# Patient Record
Sex: Female | Born: 2000 | Hispanic: Yes | Marital: Single | Smoking: Never smoker
Health system: Southern US, Community
[De-identification: ages and names within clinical notes are randomized; demographics above are authoritative.]

## PROBLEM LIST (undated history)

## (undated) DIAGNOSIS — Z789 Other specified health status: Secondary | ICD-10-CM

## (undated) HISTORY — DX: Other specified health status: Z78.9

---
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
## 2019-11-12 ENCOUNTER — Encounter (HOSPITAL_BASED_OUTPATIENT_CLINIC_OR_DEPARTMENT_OTHER): Payer: Self-pay

## 2019-11-12 ENCOUNTER — Other Ambulatory Visit: Payer: Self-pay

## 2019-11-12 ENCOUNTER — Emergency Department (HOSPITAL_BASED_OUTPATIENT_CLINIC_OR_DEPARTMENT_OTHER)
Admission: EM | Admit: 2019-11-12 | Discharge: 2019-11-12 | Disposition: A | Discharge status: Home / Self Care | Payer: No Typology Code available for payment source | Attending: Emergency Medicine | Admitting: Emergency Medicine

## 2019-11-12 ENCOUNTER — Emergency Department (HOSPITAL_BASED_OUTPATIENT_CLINIC_OR_DEPARTMENT_OTHER): Payer: No Typology Code available for payment source

## 2019-11-12 ENCOUNTER — Encounter: Payer: Self-pay | Admitting: Emergency Medicine

## 2019-11-12 ENCOUNTER — Ambulatory Visit
Admission: EM | Admit: 2019-11-12 | Discharge: 2019-11-12 | Disposition: A | Discharge status: Home / Self Care | Payer: Self-pay

## 2019-11-12 DIAGNOSIS — Y999 Unspecified external cause status: Secondary | ICD-10-CM | POA: Diagnosis not present

## 2019-11-12 DIAGNOSIS — S0990XA Unspecified injury of head, initial encounter: Secondary | ICD-10-CM

## 2019-11-12 DIAGNOSIS — Y939 Activity, unspecified: Secondary | ICD-10-CM | POA: Insufficient documentation

## 2019-11-12 DIAGNOSIS — Y92410 Unspecified street and highway as the place of occurrence of the external cause: Secondary | ICD-10-CM | POA: Diagnosis not present

## 2019-11-12 DIAGNOSIS — S022XXA Fracture of nasal bones, initial encounter for closed fracture: Secondary | ICD-10-CM | POA: Insufficient documentation

## 2019-11-12 DIAGNOSIS — S0083XA Contusion of other part of head, initial encounter: Secondary | ICD-10-CM

## 2019-11-12 LAB — PREGNANCY, URINE: Preg Test, Ur: NEGATIVE

## 2019-11-12 MED ORDER — NAPROXEN 500 MG PO TABS: 500.0000 mg | ORAL_TABLET | Freq: Two times a day (BID) | ORAL | 0 refills | Status: DC

## 2019-11-12 NOTE — Discharge Instructions (Signed)
Please go to [facility] for further evaluation  
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
## 2021-07-14 ENCOUNTER — Encounter: Payer: Self-pay | Admitting: Obstetrics and Gynecology
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
## 2022-02-03 ENCOUNTER — Ambulatory Visit
Admission: EM | Admit: 2022-02-03 | Discharge: 2022-02-03 | Disposition: A | Discharge status: Home / Self Care | Payer: PRIVATE HEALTH INSURANCE | Attending: Physician Assistant | Admitting: Physician Assistant

## 2022-02-03 ENCOUNTER — Ambulatory Visit
Admission: EM | Admit: 2022-02-03 | Discharge: 2022-02-03 | Discharge status: Absent without leave | Payer: PRIVATE HEALTH INSURANCE

## 2022-02-03 DIAGNOSIS — N3000 Acute cystitis without hematuria: Secondary | ICD-10-CM | POA: Diagnosis present

## 2022-02-03 LAB — POCT URINALYSIS DIP (MANUAL ENTRY)
Bilirubin, UA: NEGATIVE
Glucose, UA: NEGATIVE mg/dL
Ketones, POC UA: NEGATIVE mg/dL
Nitrite, UA: NEGATIVE
Protein Ur, POC: 100 mg/dL — AB
Spec Grav, UA: 1.015 (ref 1.010–1.025)
Urobilinogen, UA: 1 E.U./dL
pH, UA: 7 (ref 5.0–8.0)

## 2022-02-03 LAB — POCT URINE PREGNANCY: Preg Test, Ur: NEGATIVE

## 2022-02-03 MED ORDER — NITROFURANTOIN MONOHYD MACRO 100 MG PO CAPS: 100.0000 mg | ORAL_CAPSULE | Freq: Two times a day (BID) | ORAL | 0 refills | Status: DC

## 2022-02-03 NOTE — ED Triage Notes (Signed)
Pt c/o right rib pain, dysuria, amber-colored urine, onset last week. Also describes fever at home last night.

## 2022-02-03 NOTE — ED Provider Notes (Signed)
[facility]    CSN: [id number] Arrival date & time: 02/03/22  1013      History   Chief Complaint Chief Complaint  Patient presents with   Dysuria    HPI Katie Roman is a 21 y.o. female.   Patient here today for evaluation of amber-colored urine, dysuria that started last week.  She notes that last night she started to have fever.  She has had some right anterior rib pain as well.  She has not had any nausea or vomiting.  She has taken Tylenol without resolution of symptoms.  The history is provided by the patient.  Dysuria Associated symptoms: no abdominal pain, no fever, no nausea and no vomiting     History reviewed. No pertinent past medical history.  There are no problems to display for this patient.   History reviewed. No pertinent surgical history.  OB History   No obstetric history on file.      Home Medications    Prior to Admission medications   Medication Sig Start Date End Date Taking? Authorizing Provider  nitrofurantoin, macrocrystal-monohydrate, (MACROBID) 100 MG capsule Take 1 capsule (100 mg total) by mouth 2 (two) times daily. 02/03/22  Yes Francene Finders, PA-C  naproxen (NAPROSYN) 500 MG tablet Take 1 tablet (500 mg total) by mouth 2 (two) times daily. 11/12/19   Carlisle Cater, PA-C    Family History Family History  Problem Relation Age of Onset   Diabetes Father     Social History Social History   Tobacco Use   Smoking status: Never   Smokeless tobacco: Never  Substance Use Topics   Alcohol use: Never   Drug use: Never     Allergies   Patient has no known allergies.   Review of Systems Review of Systems  Constitutional:  Negative for chills and fever.  Eyes:  Negative for discharge and redness.  Respiratory:  Negative for shortness of breath.   Gastrointestinal:  Negative for abdominal pain, nausea and vomiting.  Genitourinary:  Positive for dysuria.     Physical Exam Triage Vital Signs ED  Triage Vitals [02/03/22 1027]  Enc Vitals Group     BP 115/82     Pulse Rate (!) 110     Resp 16     Temp 98 F (36.7 C)     Temp Source Oral     SpO2 98 %     Weight      Height      Head Circumference      Peak Flow      Pain Score 7     Pain Loc      Pain Edu?      Excl. in [place]?    No data found.  Updated Vital Signs BP 115/82 (BP Location: Left Arm)   Pulse (!) 110   Temp 98 F (36.7 C) (Oral)   Resp 16   SpO2 98%   Physical Exam Vitals and nursing note reviewed.  Constitutional:      General: She is not in acute distress.    Appearance: Normal appearance. She is not ill-appearing.  HENT:     Head: Normocephalic and atraumatic.  Eyes:     Conjunctiva/sclera: Conjunctivae normal.  Cardiovascular:     Rate and Rhythm: Normal rate.  Pulmonary:     Effort: Pulmonary effort is normal.  Neurological:     Mental Status: She is alert.  Psychiatric:        Mood and Affect:  Mood normal.        Behavior: Behavior normal.        Thought Content: Thought content normal.      UC Treatments / Results  Labs (all labs ordered are listed, but only abnormal results are displayed) Labs Reviewed  POCT URINALYSIS DIP (MANUAL ENTRY) - Abnormal; Notable for the following components:      Result Value   Clarity, UA hazy (*)    Blood, UA large (*)    Protein Ur, POC =100 (*)    Leukocytes, UA Large (3+) (*)    All other components within normal limits  URINE CULTURE  POCT URINE PREGNANCY    EKG   Radiology No results found.  Procedures Procedures (including critical care time)  Medications Ordered in UC Medications - No data to display  Initial Impression / Assessment and Plan / UC Course  I have reviewed the triage vital signs and the nursing notes.  Pertinent labs & imaging results that were available during my care of the patient were reviewed by me and considered in my medical decision making (see chart for details).   UA consistent with UTI.  Macrobid  prescribed for same.  Urine culture ordered.  It is unclear if of right rib pain is related to current symptoms or just coincidental timing.  Recommended follow-up if no gradual improvement or with any further concerns.  Final Clinical Impressions(s) / UC Diagnoses   Final diagnoses:  Acute cystitis without hematuria   Discharge Instructions   None    ED Prescriptions     Medication Sig Dispense Auth. Provider   nitrofurantoin, macrocrystal-monohydrate, (MACROBID) 100 MG capsule Take 1 capsule (100 mg total) by mouth 2 (two) times daily. 10 capsule Tomi Bamberger, PA-C      PDMP not reviewed this encounter.   Tomi Bamberger, PA-C 02/03/22 1119

## 2022-02-05 LAB — URINE CULTURE: Culture: >=100000 — AB

## 2024-03-29 ENCOUNTER — Ambulatory Visit: Payer: PRIVATE HEALTH INSURANCE

## 2024-03-29 VITALS — BP 126/78 | HR 94

## 2024-03-29 DIAGNOSIS — Z3200 Encounter for pregnancy test, result unknown: Secondary | ICD-10-CM

## 2024-03-29 DIAGNOSIS — Z3201 Encounter for pregnancy test, result positive: Secondary | ICD-10-CM

## 2024-03-29 LAB — POCT URINE PREGNANCY: Preg Test, Ur: POSITIVE — AB

## 2024-03-29 NOTE — Progress Notes (Signed)
.  Ms. Hernandez-Plata presents today for UPT. She has no unusual complaints. LMP:02/20/24    OBJECTIVE: Appears well, in no apparent distress.  OB History     Gravida  1   Para      Term      Preterm      AB      Living         SAB      IAB      Ectopic      Multiple      Live Births             Home UPT Result:Positive In-Office UPT result:Positive I have reviewed the patient's medical, obstetrical, social, and family histories, and medications.   ASSESSMENT: Positive pregnancy test  PLAN Prenatal care to be completed at: [facility] Provided safe med list Pt will get Prenatals OTC at this time due to lapse in insurance

## 2024-04-26 ENCOUNTER — Other Ambulatory Visit (INDEPENDENT_AMBULATORY_CARE_PROVIDER_SITE_OTHER): Payer: Self-pay

## 2024-04-26 ENCOUNTER — Ambulatory Visit (INDEPENDENT_AMBULATORY_CARE_PROVIDER_SITE_OTHER): Payer: Self-pay | Admitting: *Deleted

## 2024-04-26 VITALS — BP 117/63 | HR 75 | Wt 165.2 lb

## 2024-04-26 DIAGNOSIS — O3680X0 Pregnancy with inconclusive fetal viability, not applicable or unspecified: Secondary | ICD-10-CM

## 2024-04-26 DIAGNOSIS — Z3480 Encounter for supervision of other normal pregnancy, unspecified trimester: Secondary | ICD-10-CM | POA: Insufficient documentation

## 2024-04-26 DIAGNOSIS — Z3401 Encounter for supervision of normal first pregnancy, first trimester: Secondary | ICD-10-CM

## 2024-04-26 DIAGNOSIS — Z3A10 10 weeks gestation of pregnancy: Secondary | ICD-10-CM

## 2024-04-26 MED ORDER — PREPLUS 27-1 MG PO TABS: 1.0000 | ORAL_TABLET | Freq: Every day | ORAL | 13 refills | Status: AC

## 2024-04-26 NOTE — Patient Instructions (Signed)
 [facility] [employer] [facility] has a partnership with the [employer] to provide prenatal navigation for the most needed resources in our community. In order to see how we can help connect you to these resources we need consent to contact you. Please complete the very short consent using the link below:   English Link: https://[email or web address].[email or web address]?site=16  Spanish Link: https://[email or web address].tfaforms.[email or web address]?site=16  [facility]      [address]rd floor [place], [place] [address] Tel: [phone]  Attention residents of [facility] who do not qualify for Medicaid, and who do not have insurance, and/ or money to cover prenatal care:  Early and adequate prenatal care is very important for you and your baby.  If you or someone you know is pregnant and needs prenatal care they can:  Make an appointment with the [facility] Patient Coordinator by calling [phone] and bring the following with you to the appointment:  1. Medicaid Denial Letter 2. Proof of Pregnancy 3. $100 (cash or Money order) 4. Photo ID  Develop a healthy lifestyle for you and your baby by eating nutritious food and exercising. Apply for [facility] if needed by calling  [phone]. Begin taking a vitamin with at least 400 mcg of Folic Acid each day to help prevent birth defects. Avoid tobacco products, alcohol, and drugs.  Call 1-800-QUIT-NOW for help to quit smoking.  Text BABY to 551-119-3946 and get FREE educational messages on your cell phone to help you through your pregnancy and your baby's first year. For more info, visit www.text4baby.org   (Information provided by the [facility] on [employer])

## 2024-04-26 NOTE — Progress Notes (Signed)
 New OB Intake  I connected with Katie Roman  on 04/26/2024 at  2:10 PM EST by In PersonVisit and verified that I am speaking with the correct person using two identifiers. Nurse is located at [facility] and pt is located at [place].  I discussed the limitations, risks, security and privacy concerns of performing an evaluation and management service by telephone and the availability of in person appointments. I also discussed with the patient that there may be a patient responsible charge related to this service. The patient expressed understanding and agreed to proceed.  I explained I am completing New OB Intake today. We discussed EDD of 11/22/24 based on LMP of 02/16/24. Pt is G1P0000. I reviewed her allergies, medications and Medical/Surgical/OB history.    Patient Active Problem List   Diagnosis Date Noted   Supervision of other normal pregnancy, antepartum 04/26/2024     Concerns addressed today  Delivery Plans Plans to deliver at [facility]. Discussed the nature of our practice with multiple providers including residents and students as well as female and female providers. Due to the size of the practice, the delivering provider may not be the same as those providing prenatal care.   Patient is not interested in water birth.  MyChart/Babyscripts MyChart access verified. I explained pt will have some visits in office and some virtually. Babyscripts instructions given and order placed. Patient verifies receipt of registration text/e-mail. Account successfully created and app downloaded. If patient is a candidate for Optimized scheduling, add to sticky note.   Blood Pressure Cuff/Weight Scale Patient is self-pay; explained patient will be given BP cuff at first prenatal appt. Explained after first prenatal appt pt will check weekly and document in Babyscripts. Patient does not have weight scale; patient may purchase if they desire to track weight weekly in Babyscripts.  Anatomy  US  Explained first scheduled US  will be around 19 weeks. Anatomy US  scheduled for TBD at TBD.  Is patient a candidate for Babyscripts Optimization? Yes, patient accepted    First visit review I reviewed new OB appt with patient. Explained pt will be seen by Katie Moats, PA at first visit. Discussed Katie Roman genetic screening with patient. Requests [facility] and [employer].. Routine prenatal labs collected at today's visit.   Last Pap No results found for: DIAGPAP  Katie CHRISTELLA Ober, RN 04/26/2024  3:08 PM

## 2024-04-28 ENCOUNTER — Inpatient Hospital Stay (HOSPITAL_COMMUNITY)
Admission: AD | Admit: 2024-04-28 | Discharge: 2024-04-28 | Disposition: A | Discharge status: Home / Self Care | Payer: Self-pay | Attending: Obstetrics and Gynecology | Admitting: Obstetrics and Gynecology

## 2024-04-28 ENCOUNTER — Other Ambulatory Visit: Payer: Self-pay

## 2024-04-28 DIAGNOSIS — O23591 Infection of other part of genital tract in pregnancy, first trimester: Secondary | ICD-10-CM | POA: Insufficient documentation

## 2024-04-28 DIAGNOSIS — O26851 Spotting complicating pregnancy, first trimester: Secondary | ICD-10-CM | POA: Insufficient documentation

## 2024-04-28 DIAGNOSIS — B9689 Other specified bacterial agents as the cause of diseases classified elsewhere: Secondary | ICD-10-CM | POA: Insufficient documentation

## 2024-04-28 DIAGNOSIS — Z3A10 10 weeks gestation of pregnancy: Secondary | ICD-10-CM

## 2024-04-28 DIAGNOSIS — N939 Abnormal uterine and vaginal bleeding, unspecified: Secondary | ICD-10-CM

## 2024-04-28 DIAGNOSIS — O26891 Other specified pregnancy related conditions, first trimester: Secondary | ICD-10-CM

## 2024-04-28 LAB — WET PREP, GENITAL
Sperm: NONE SEEN
Trich, Wet Prep: NONE SEEN
WBC, Wet Prep HPF POC: >=10 — AB
Yeast Wet Prep HPF POC: NONE SEEN

## 2024-04-28 LAB — URINE CULTURE, OB REFLEX

## 2024-04-28 LAB — CULTURE, OB URINE

## 2024-04-28 MED ORDER — METRONIDAZOLE 500 MG PO TABS: 500.0000 mg | ORAL_TABLET | Freq: Two times a day (BID) | ORAL | 0 refills | Status: AC

## 2024-04-28 NOTE — MAU Note (Signed)
 Katie Roman is a 24 y.o. at [redacted]w[redacted]d here in MAU reporting: was having lower back pain yesterday that is gone now. Went to the bathroom and light spotting when she wiped. Is not wearing a pad. Denies any abd pain today. Patient reports yellow/green vaginal discharge yesterday but none today so far. Denies any vaginal itching, odor or pain. Denies any recent sexual intercourse.   Results from US  Thursday 04/26/24:  Single viable intrauterine pregnancy at [redacted]w[redacted]d by CRL with  ultrasound EDD 11/25/2024  This is concordant with her LMP for assigned EDD of 11/22/2024  Onset of complaint: today Pain score: 0 Vitals:   04/28/24 1018  BP: (!) 115/57  Pulse: 82  Resp: 16  Temp: 98.2 F (36.8 C)  SpO2: 100%     QYU:juuzfeuzi, unable to obtain Lab orders placed from triage:

## 2024-04-28 NOTE — MAU Provider Note (Signed)
 CC/ Vaginal spotting while wiping    S/HPI  Ms. Katie Roman is a 24 y.o. G1P0000 patient who presents to MAU today with complaint of ***.     O BP (!) 115/57 (BP Location: Right Arm)   Pulse 82   Temp 98.2 F (36.8 C) (Oral)   Resp 16   Ht 5' 6 (1.676 m)   Wt 74.4 kg   LMP 02/16/2024 (Exact Date)   SpO2 100%   BMI 26.49 kg/m  Physical Exam Vitals and nursing note reviewed.  Constitutional:      General: She is not in acute distress.    Appearance: Normal appearance. She is not ill-appearing.  HENT:     Head: Normocephalic.     Nose: Nose normal.     Mouth/Throat:     Mouth: Mucous membranes are moist.  Cardiovascular:     Rate and Rhythm: Normal rate.     Pulses: Normal pulses.  Pulmonary:     Effort: Pulmonary effort is normal.  Abdominal:     General: There is no distension.     Palpations: Abdomen is soft.     Tenderness: There is no abdominal tenderness.  Musculoskeletal:        General: Normal range of motion.     Cervical back: Normal range of motion.  Skin:    General: Skin is warm.  Neurological:     Mental Status: She is alert and oriented to person, place, and time.  Psychiatric:        Mood and Affect: Mood normal.        Behavior: Behavior normal.     MDM  MODERATE/ BSUS performed    Spotting in early pregnancy  Vaginal bleeding in early pregnacy CBC HCG Quant ABO OB Ultrasound Vaginal Swabs UA   Differential diagnosis considered for 1st trimester vaginal bleeding includes but is not limited to: ectopic pregnancy, complete spontaneous abortion, incomplete abortion, missed abortion, threatened abortion, embryonic/fetal demise, cervical insufficiency, cervical or vaginal disorder     BSUS  Pt informed that the ultrasound is considered a limited OB ultrasound and is not intended to be a complete ultrasound exam.  Patient also informed that the ultrasound is not being completed with the intent of assessing for fetal or  placental anomalies or any pelvic abnormalities.  Explained that the purpose of todays ultrasound is to assess for  maternal reassurance .  Patient acknowledges the purpose of the exam and the limitations of the study.   FHR at 174, IUP visualized with cardiac activity   Orders Placed This Encounter  Procedures   Wet prep, genital    Standing Status:   Standing    Number of Occurrences:   1   Discharge patient Discharge disposition: 01-Home or Self Care; Discharge patient date: 04/28/2024    Standing Status:   Standing    Number of Occurrences:   1    Discharge disposition:   01-Home or Self Care [1]    Discharge patient date:   04/28/2024      Results for orders placed or performed during the hospital encounter of 04/28/24 (from the past 24 hours)  Wet prep, genital     Status: Abnormal   Collection Time: 04/28/24 10:35 AM  Result Value Ref Range   Yeast Wet Prep HPF POC NONE SEEN NONE SEEN   Trich, Wet Prep NONE SEEN NONE SEEN   Clue Cells Wet Prep HPF POC PRESENT (A) NONE SEEN   WBC, Wet Prep HPF  POC >=10 (A) <10   Sperm NONE SEEN       ASSESSMENT Medical screening exam complete  Vaginal spotting Only when wiping Likely related to Vaginal infection BSUS reassuring  [redacted] weeks gestation of pregnancy FHR at 174 via BSUS IUP visualized  Bacterial vaginosis Wet prep with Clue cells c/w BV Flagyl  RX 500 mg BID x 7 days sent to patient's pharmacy    PLAN Future Appointments  Date Time Provider Department Center  05/11/2024  9:15 AM Davis, Devon E, PA-C [facility] None    Discharge from MAU in stable condition  See AVS for full description of educational information and instructions provided to the patient at time of discharge   Warning signs for worsening condition that would warrant emergency follow-up discussed  Patient may return to MAU as needed   Littie Olam LABOR, NP 04/28/2024 10:58 AM

## 2024-04-28 NOTE — Discharge Instructions (Signed)
 Commonly Asked Questions During Pregnancy  How Will I Feel When I'm Pregnant? Pregnancy symptoms in the first trimester of pregnancy may not appear until the middle or end of the second month. Hormonal changes will cause tenderness in your breasts, and you may begin to feel more tired than usual. Food cravings, an increase in the need to urinate, and morning sickness may all be more noticeable.  Pregnancy symptoms in the second trimester are more prominent. You may start to feel the baby move and become more active. Dental issues, nasal/sinus problems, and skin irritations can begin to appear. Heartburn, leg cramps, dizziness, and a vaginal discharge are also common. Every woman is different when it comes to the symptoms they experience, and some may not experience any at all. Pregnancy symptoms in the third trimester can include increased frequency in urination, leg cramps, constipation, ligament pain in the abdomen, and weight gain. Back pain and Braxton Hicks contractions will become increasingly more common.  Why is nutrition during pregnancy important? Eating well is one of the best things you can do during pregnancy. Good nutrition helps you handle the extra demands on your body as your pregnancy progresses. The goal is to balance getting enough nutrients to support the growth of your fetus and maintaining a healthy weight.  How much water should I drink during pregnancy? During pregnancy you should drink 8 to 12 cups (64 to 96 ounces) of water every day. Water has many benefits. It aids digestion and helps form the amniotic fluid around the fetus. Water also helps nutrients circulate in the body and helps waste leave the body.  What can I do to help with nausea? Eat dry toast or crackers in the morning before you get out of bed to avoid moving around on an empty stomach. Eat five or six "mini meals" a day to ensure that your stomach is never empty. Eat frequent bites of foods like nuts,  fruits, or crackers.  What can help with constipation during pregnancy? Constipation is common near the end of pregnancy. Eating more foods with fiber can help fight constipation. Fiber is found in fruits, vegetables, whole grains, beans, nuts, and seeds. You should aim for about 25 grams of fiber in your diet each day. Drink a lot of water as you increase your fiber intake.  How much coffee can I drink while I'm pregnant? Research suggests that moderate caffeine consumption (less than 200 milligrams per day) does not cause miscarriage or preterm birth. That's the amount in one 12-ounce cup of coffee. Remember that caffeine also is found in tea, chocolate, energy drinks, and soft drinks. Caffeine can interfere with sleep and contribute to nausea and light-headedness. Caffeine also can increase urination and lead to dehydration.  What can I do to prevent or ease back pain during pregnancy? There are several things you can do to prevent or ease back pain. For example, wear supportive clothing and shoes. Pay attention to your position when sitting, sleeping, and lifting things. If you need to stand for a long time, rest one foot on a stool or a box to take the strain off your back. You also can use heat or cold to soothe sore muscles.  Is it safe to exercise during pregnancy? If you are healthy and your pregnancy is normal, it is safe to continue or start regular physical activity. Physical activity does not increase your risk of miscarriage, low birth weight, or early delivery. It's still important to discuss exercise with your ob-gyn  provider during your early prenatal visits.   What are the benefits of exercise during pregnancy? Regular exercise during pregnancy benefits you and your fetus in these key ways: Reduces back pain Eases constipation May decrease your risk of gestational diabetes, preeclampsia, and cesarean birth Promotes healthy weight gain during pregnancy Improves your overall  fitness and strengthens your heart and blood vessels Helps you to lose the baby weight after your baby is born  Is it safe to dye my hair during pregnancy? Yes, it's safe. Only a small amount of chemicals from hair dye is absorbed through the scalp.  Is it safe to keep a cat during pregnancy? Yes, you can keep your cat. You may have heard that cat feces can carry the infection toxoplasmosis. This infection is only found in cats who go outdoors and hunt prey, such as mice and other rodents. If you do have a cat who goes outdoors or eats prey, have someone else take over daily cleaning the litter box. This will keep you away from any cat feces. If you have an indoor cat who only eats cat food and doesn't have contact with outside animals, your risk of toxoplasmosis is very low.  What substances should I avoid during pregnancy? During pregnancy, women should not use tobacco, alcohol, marijuana, illegal drugs, or prescription medications for nonmedical reasons. Avoiding these substances and getting regular prenatal care are important to having a healthy pregnancy and a healthy baby.   What foods to I need to avoid in pregnancy? To help prevent listeriosis, avoid eating the following foods while you are pregnant: Unpasteurized milk and foods made with unpasteurized milk, including soft cheeses Hot dogs and luncheon meats, unless they are heated until steaming hot just before serving Unwashed raw produce such as fruits and vegetables  Avoid all raw and undercooked seafood, eggs, meat, and poultry while you are pregnant. Do not eat sushi made with raw fish (cooked sushi is safe). Cooking and pasteurization are the only ways to kill Listeria.  Limit your exposure to mercury by not eating bigeye tuna, king mackerel, marlin, orange roughy, shark, swordfish, or tilefish. Limit eating white (albacore) tuna to 6 ounces a week. You do not have to avoid all fish during pregnancy. In fact, fish and shellfish are  nutritious foods with vital nutrients for a pregnant woman and her fetus. Be sure to eat at least 8-12 ounces of low-mercury fish and shellfish per week.  Is travel safe to during pregnancy? In most cases, pregnant women can travel safely until close to their due dates. But travel may not be recommended for women who have pregnancy complications. If you are planning a trip, talk with your (ob-gyn) provider. And no matter how you choose to travel, think ahead about your comfort and safety.  Can I use a sauna or hot tub early in pregnancy? It's best not to. Your core body temperature rises when you use saunas and hot tubs. This rise in temperature can be harmful for your fetus.  Can I get a massage while pregnant? Yes. Massage is a good way to relax and improve circulation. The best position for a massage while you're pregnant is lying on your side, rather than facedown. Some massage tables have a cut-out for the belly, allowing you to lie facedown comfortably. Tell your massage therapist that you're pregnant if you're not showing yet. Many health spas offer special prenatal massages done by therapists who are trained to work on pregnant women.  Is Having Dental  Work While Pregnant Safe? Pregnancy and dental work questions are common for expecting moms. Preventive dental cleanings and annual exams during pregnancy are not only safe but are recommended. The rise in hormone levels during pregnancy causes the gums to swell, bleed, and trap food causing increased irritation to your gums. Preventive dental work while pregnant is essential to avoid oral infections such as gum disease, which has been linked to preterm birth. The American Dental Association (ADA) recommends pregnant women eat a balanced diet, brush their teeth thoroughly with ADA-approved fluoride toothpaste twice a day, and floss daily. Have preventive exams and cleanings during your pregnancy. Let your dentist know you are  pregnant. Postpone non-emergency dental work until the second trimester or after delivery, if possible. Elective procedures should be postponed until after the delivery.    Safe Medications in Pregnancy   Acne:  Benzoyl Peroxide  Salicylic Acid   Backache/Headache:  Tylenol: 2 regular strength every 4 hours OR               2 Extra strength every 6 hours   Colds/Coughs/Allergies:  Benadryl (alcohol free) 25 mg every 6 hours as needed  Breath right strips  Claritin  Cepacol throat lozenges  Chloraseptic throat spray  Cold-Eeze- up to three times per day  Cough drops, alcohol free  Flonase (by prescription only)  Guaifenesin  Mucinex  Robitussin DM (plain only, alcohol free)  Saline nasal spray/drops  Sudafed (pseudoephedrine) & Actifed * use only after [redacted] weeks gestation and if you do not have high blood pressure  Tylenol  Vicks Vaporub  Zinc lozenges  Zyrtec   Constipation:  Colace  Ducolax suppositories  Fleet enema  Glycerin suppositories  Metamucil  Milk of magnesia  Miralax  Senokot  Smooth move tea   Diarrhea:  Kaopectate  Imodium A-D   *NO pepto Bismol   Hemorrhoids:  Anusol  Anusol HC  Preparation H  Tucks   Indigestion:  Tums  Maalox  Mylanta  Zantac  Pepcid   Insomnia:  Benadryl (alcohol free) 25mg  every 6 hours as needed  Tylenol PM  Unisom, no Gelcaps   Leg Cramps:  Tums  MagGel   Nausea/Vomiting:  Bonine  Dramamine  Emetrol  Ginger extract  Sea bands  Meclizine  Nausea medication to take during pregnancy:  Unisom (doxylamine succinate 25 mg tablets) Take one tablet daily at bedtime. If symptoms are not adequately controlled, the dose can be increased to a maximum recommended dose of two tablets daily (1/2 tablet in the morning, 1/2 tablet mid-afternoon and one at bedtime).  Vitamin B6 100mg  tablets. Take one tablet twice a day (up to 200 mg per day).   Skin Rashes:  Aveeno products  Benadryl cream or 25mg  every 6 hours  as needed  Calamine Lotion  1% cortisone cream   Yeast infection:  Gyne-lotrimin 7  Monistat 7    **If taking multiple medications, please check labels to avoid duplicating the same active ingredients  **take medication as directed on the label  ** Do not exceed 4000 mg of tylenol in 24 hours  **Do not take medications that contain aspirin or ibuprofen

## 2024-04-30 LAB — GC/CHLAMYDIA PROBE AMP (~~LOC~~) NOT AT ARMC
Chlamydia: NEGATIVE
Comment: NEGATIVE
Comment: NORMAL
Neisseria Gonorrhea: NEGATIVE

## 2024-05-03 LAB — PANORAMA PRENATAL TEST FULL PANEL:PANORAMA TEST PLUS 5 ADDITIONAL MICRODELETIONS: FETAL FRACTION: 4.4

## 2024-05-04 ENCOUNTER — Ambulatory Visit: Payer: Self-pay | Admitting: Obstetrics and Gynecology

## 2024-05-04 DIAGNOSIS — Z3480 Encounter for supervision of other normal pregnancy, unspecified trimester: Secondary | ICD-10-CM

## 2024-05-11 ENCOUNTER — Ambulatory Visit (INDEPENDENT_AMBULATORY_CARE_PROVIDER_SITE_OTHER): Payer: Self-pay | Admitting: Physician Assistant

## 2024-05-11 ENCOUNTER — Other Ambulatory Visit (HOSPITAL_COMMUNITY)
Admission: RE | Admit: 2024-05-11 | Discharge: 2024-05-11 | Disposition: A | Payer: Self-pay | Source: Ambulatory Visit | Attending: Physician Assistant | Admitting: Physician Assistant

## 2024-05-11 ENCOUNTER — Encounter: Payer: Self-pay | Admitting: Physician Assistant

## 2024-05-11 VITALS — BP 118/68 | HR 92 | Wt 164.6 lb

## 2024-05-11 DIAGNOSIS — Z3480 Encounter for supervision of other normal pregnancy, unspecified trimester: Secondary | ICD-10-CM | POA: Insufficient documentation

## 2024-05-11 DIAGNOSIS — Z124 Encounter for screening for malignant neoplasm of cervix: Secondary | ICD-10-CM

## 2024-05-11 DIAGNOSIS — O26892 Other specified pregnancy related conditions, second trimester: Secondary | ICD-10-CM

## 2024-05-11 DIAGNOSIS — Z3A12 12 weeks gestation of pregnancy: Secondary | ICD-10-CM

## 2024-05-11 DIAGNOSIS — N898 Other specified noninflammatory disorders of vagina: Secondary | ICD-10-CM

## 2024-05-11 LAB — CERVICOVAGINAL ANCILLARY ONLY
Bacterial Vaginitis (gardnerella): NEGATIVE
Candida Glabrata: NEGATIVE
Candida Vaginitis: NEGATIVE
Chlamydia: NEGATIVE
Comment: NEGATIVE
Comment: NEGATIVE
Comment: NEGATIVE
Comment: NEGATIVE
Comment: NEGATIVE
Comment: NORMAL
Neisseria Gonorrhea: NEGATIVE
Trichomonas: NEGATIVE

## 2024-05-11 NOTE — Progress Notes (Signed)
 NOB  Pt would like to discuss vaginal discharge (sometimes yellow).  Pt would like to discuss mild pressure in lower abdomen.

## 2024-05-11 NOTE — Progress Notes (Signed)
 "   PRENATAL VISIT NOTE  Subjective:  Katie Roman is a 24 y.o. G1P0000 at [redacted]w[redacted]d being seen today for her first prenatal visit for this pregnancy.  She is currently monitored for the following issues for this low-risk pregnancy and has Supervision of other normal pregnancy, antepartum on their problem list.  Patient reports yellow vaginal discharge that she is concerned about without itching, burning, malodor.  Previous episode of vaginal spotting this pregnancy, went to MAU for this 04/28/24. She has had no spotting since then.    Contractions: Not present. Vag. Bleeding: None.  Movement: Present. Denies leaking of fluid.   She is planning to breastfeed. Desires unsure for contraception.   The following portions of the patient's history were reviewed and updated as appropriate: allergies, current medications, past family history, past medical history, past social history, past surgical history and problem list.   Objective:   Vitals:   05/11/24 0915  BP: 118/68  Pulse: 92  Weight: 164 lb 9.6 oz (74.7 kg)    Fetal Status: Fetal Heart Rate (bpm): visual on tablet   Movement: Present     General:  Alert, oriented and cooperative. Patient is in no acute distress.  Skin: Skin is warm and dry. No rash noted.   Cardiovascular: Normal heart rate and rhythm noted  Respiratory: Normal respiratory effort, no problems with respiration noted. Clear to auscultation.   Abdomen: Soft, gravid, appropriate for gestational age. Normal bowel sounds. Non-tender. Pain/Pressure: Present     Pelvic: Cervical exam deferred       Normal cervical contour, no lesions, no bleeding following pap, normal discharge  Extremities: Normal range of motion.  Edema: None  Mental Status: Normal mood and affect. Normal behavior. Normal judgment and thought content.    Indications for ASA therapy (per uptodate) One of the following: Previous pregnancy with preeclampsia, especially early onset and with an  adverse outcome No Multifetal gestation No Chronic hypertension No Type 1 or 2 diabetes mellitus No Chronic kidney disease No Autoimmune disease (antiphospholipid syndrome, systemic lupus erythematosus) No  Two or more of the following: Nulliparity Yes Obesity (body mass index >30 kg/m2) No Family history of preeclampsia in mother or sister No Age >=35 years No Sociodemographic characteristics (African American race, low socioeconomic level) No Personal risk factors (eg, previous pregnancy with low birth weight or small for gestational age infant, previous adverse pregnancy outcome [eg, stillbirth], interval >10 years between pregnancies) No   Assessment and Plan:  Pregnancy: G1P0000 at [redacted]w[redacted]d  1. Supervision of other normal pregnancy, antepartum (Primary) Initial labs drawn. Continue prenatal vitamins. Genetic Screening discussed: NIPS, carrier screening and AFP  Ultrasound discussed; fetal anatomic survey: 07/05/24 Problem list reviewed and updated. Reviewed Brx optimized schedule, patient agreeable The nature of [facility] with multiple MDs and other Advanced Practice Providers was explained to patient; also emphasized that residents, students are part of our team. Routine obstetric precautions reviewed.  - HgB A1c - CBC/D/Plt+RPR+Rh+ABO+RubIgG... - PANORAMA PRENATAL TEST - HORIZON CUSTOM - Culture, OB Urine  2. [redacted] weeks gestation of pregnancy Anticipatory guidance about next visits/weeks of pregnancy given.   3. Cervical cancer screening First pap smear today - Cytology - PAP( [place])  4. Vaginal discharge during pregnancy, antepartum - Cervicovaginal ancillary only( [place])  Preterm labor/first trimester warning symptoms and general obstetric precautions including but not limited to vaginal bleeding, contractions, leaking of fluid and fetal movement were reviewed in detail with the patient.  Please  refer to After Visit  Summary for other counseling recommendations.   Return in about 4 weeks (around 06/08/2024) for LOB.  Future Appointments  Date Time Provider Department Center  07/05/2024  8:00 AM [facility] PROVIDER 1 [facility]  07/05/2024  8:30 AM [facility] US1 [facility]    Jorene FORBES Moats, PA-C  "

## 2024-05-12 LAB — CBC/D/PLT+RPR+RH+ABO+RUBIGG...
Antibody Screen: NEGATIVE
Basophils Absolute: 0 10*3/uL (ref 0.0–0.2)
Basos: 0 %
EOS (ABSOLUTE): 0 10*3/uL (ref 0.0–0.4)
Eos: 0 %
HCV Ab: NONREACTIVE
HIV Screen 4th Generation wRfx: NONREACTIVE
Hematocrit: 37.6 % (ref 34.0–46.6)
Hemoglobin: 12.4 g/dL (ref 11.1–15.9)
Hepatitis B Surface Ag: NEGATIVE
Immature Grans (Abs): 0.1 10*3/uL (ref 0.0–0.1)
Immature Granulocytes: 1 %
Lymphocytes Absolute: 1.3 10*3/uL (ref 0.7–3.1)
Lymphs: 15 %
MCH: 29.6 pg (ref 26.6–33.0)
MCHC: 33 g/dL (ref 31.5–35.7)
MCV: 90 fL (ref 79–97)
Monocytes Absolute: 0.6 10*3/uL (ref 0.1–0.9)
Monocytes: 6 %
Neutrophils Absolute: 6.9 10*3/uL (ref 1.4–7.0)
Neutrophils: 78 %
Platelets: 256 10*3/uL (ref 150–450)
RBC: 4.19 x10E6/uL (ref 3.77–5.28)
RDW: 12.9 % (ref 11.7–15.4)
RPR Ser Ql: NONREACTIVE
Rh Factor: POSITIVE
Rubella Antibodies, IGG: 1.11 {index}
WBC: 8.9 10*3/uL (ref 3.4–10.8)

## 2024-05-12 LAB — HEMOGLOBIN A1C
Est. average glucose Bld gHb Est-mCnc: 103 mg/dL
Hgb A1c MFr Bld: 5.2 % (ref 4.8–5.6)

## 2024-05-12 LAB — HCV INTERPRETATION

## 2024-05-14 ENCOUNTER — Ambulatory Visit: Payer: Self-pay | Admitting: Physician Assistant

## 2024-05-16 LAB — CYTOLOGY - PAP: Diagnosis: NEGATIVE

## 2024-05-17 LAB — CULTURE, OB URINE

## 2024-05-17 LAB — URINE CULTURE, OB REFLEX

## 2024-05-21 LAB — HORIZON CUSTOM: REPORT SUMMARY: NEGATIVE

## 2024-05-23 LAB — PANORAMA PRENATAL TEST FULL PANEL:PANORAMA TEST PLUS 5 ADDITIONAL MICRODELETIONS: FETAL FRACTION: 5.5

## 2024-05-24 ENCOUNTER — Other Ambulatory Visit: Payer: Self-pay

## 2024-05-24 MED ORDER — DICLEGIS 10-10 MG PO TBEC: 2.0000 | DELAYED_RELEASE_TABLET | Freq: Every day | ORAL | 2 refills | Status: AC

## 2024-06-05 ENCOUNTER — Encounter: Payer: Self-pay | Admitting: Physician Assistant

## 2024-07-05 ENCOUNTER — Ambulatory Visit: Payer: Self-pay
